# Patient Record
Sex: Female | Born: 2005 | Race: Black or African American | Hispanic: No | Marital: Single | Smoking: Never smoker
Health system: Southern US, Community
[De-identification: ages and names within clinical notes are randomized; demographics above are authoritative.]

## PROBLEM LIST (undated history)

## (undated) DIAGNOSIS — J45909 Unspecified asthma, uncomplicated: Secondary | ICD-10-CM

---
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
## 2025-01-19 ENCOUNTER — Ambulatory Visit: Admitting: Family Medicine
